# Patient Record
Sex: Male | Born: 1985 | Race: Asian | Hispanic: No | Smoking: Never smoker
Health system: Southern US, Community
[De-identification: ages and names within clinical notes are randomized; demographics above are authoritative.]

---

## 2004-06-04 ENCOUNTER — Inpatient Hospital Stay (HOSPITAL_COMMUNITY): Admission: AC | Admit: 2004-06-04 | Discharge: 2004-06-04 | Payer: Self-pay
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
## 2022-08-23 ENCOUNTER — Ambulatory Visit
Admission: EM | Admit: 2022-08-23 | Discharge: 2022-08-23 | Disposition: A | Discharge status: Home / Self Care | Payer: 59 | Attending: Nurse Practitioner | Admitting: Nurse Practitioner

## 2022-08-23 DIAGNOSIS — J029 Acute pharyngitis, unspecified: Secondary | ICD-10-CM

## 2022-08-23 DIAGNOSIS — J101 Influenza due to other identified influenza virus with other respiratory manifestations: Secondary | ICD-10-CM | POA: Diagnosis not present

## 2022-08-23 LAB — POCT INFLUENZA A/B
Influenza A, POC: NEGATIVE — AB
Influenza B, POC: POSITIVE — AB

## 2022-08-23 LAB — POCT RAPID STREP A (OFFICE): Rapid Strep A Screen: NEGATIVE

## 2022-08-23 MED ORDER — OSELTAMIVIR PHOSPHATE 75 MG PO CAPS: 75.0000 mg | ORAL_CAPSULE | Freq: Two times a day (BID) | ORAL | 0 refills | Status: AC

## 2022-08-23 MED ORDER — PROMETHAZINE-DM 6.25-15 MG/5ML PO SYRP: 5.0000 mL | ORAL_SOLUTION | Freq: Four times a day (QID) | ORAL | 0 refills | Status: AC | PRN

## 2022-08-23 NOTE — Discharge Instructions (Signed)
Tamiflu twice daily for 5 days Promethazine DM as needed for cough.  Please do this medication make you drowsy.  Do not drink alcohol or drive on the medication Rest and fluids Over-the-counter Tylenol or ibuprofen as needed for fever management Salt water gargles and warm liquids for your sore throat Follow-up with your PCP if symptoms do not improve Please go to the emergency room for any worsening symptoms

## 2022-08-23 NOTE — ED Provider Notes (Addendum)
[facility]    CSN: [id number] Arrival date & time: 08/23/22  0803      History   Chief Complaint Chief Complaint  Patient presents with   Sore Throat   Fatigue    HPI Shane Le is a 37 y.o. male presents for evaluation of URI symptoms for 2 days. Patient reports associated symptoms of sore throat, fatigue, body aches, congestion. Denies N/V/D, cough, ear pain, shortness of breath. Patient does not have a hx of asthma or smoking.  States wife was recently ill.  Pt has taken DayQuil NyQuil OTC for symptoms. Pt has no other concerns at this time.    Sore Throat    History reviewed. No pertinent past medical history.  There are no problems to display for this patient.   History reviewed. No pertinent surgical history.     Home Medications    Prior to Admission medications   Medication Sig Start Date End Date Taking? Authorizing Provider  oseltamivir (TAMIFLU) 75 MG capsule Take 1 capsule (75 mg total) by mouth every 12 (twelve) hours. 08/23/22  Yes Melynda Ripple, NP  promethazine-dextromethorphan (PROMETHAZINE-DM) 6.25-15 MG/5ML syrup Take 5 mLs by mouth 4 (four) times daily as needed for cough. 08/23/22  Yes Melynda Ripple, NP    Family History History reviewed. No pertinent family history.  Social History Social History   Tobacco Use   Smoking status: Never   Smokeless tobacco: Never     Allergies   Patient has no allergy information on record.   Review of Systems Review of Systems  Constitutional:  Positive for fatigue.  HENT:  Positive for congestion and sore throat.   Musculoskeletal:  Positive for myalgias.     Physical Exam Triage Vital Signs ED Triage Vitals [08/23/22 0814]  Enc Vitals Group     BP 120/76     Pulse Rate 91     Resp 18     Temp 100.2 F (37.9 C)     Temp Source Oral     SpO2 98 %     Weight      Height      Head Circumference      Peak Flow      Pain Score      Pain Loc      Pain Edu?      Excl. in [place]?     No data found.  Updated Vital Signs BP 120/76 (BP Location: Left Arm)   Pulse 91   Temp 100.2 F (37.9 C) (Oral)   Resp 18   SpO2 98%   Visual Acuity Right Eye Distance:   Left Eye Distance:   Bilateral Distance:    Right Eye Near:   Left Eye Near:    Bilateral Near:     Physical Exam Vitals and nursing note reviewed.  Constitutional:      General: He is not in acute distress.    Appearance: Normal appearance. He is not ill-appearing or toxic-appearing.  HENT:     Head: Normocephalic and atraumatic.     Right Ear: Tympanic membrane and ear canal normal.     Left Ear: Tympanic membrane and ear canal normal.     Nose: Congestion present.     Mouth/Throat:     Mouth: Mucous membranes are moist.     Pharynx: Posterior oropharyngeal erythema present.  Eyes:     Pupils: Pupils are equal, round, and reactive to light.  Cardiovascular:     Rate and Rhythm:  Normal rate and regular rhythm.     Heart sounds: Normal heart sounds.  Pulmonary:     Effort: Pulmonary effort is normal.     Breath sounds: Normal breath sounds.  Musculoskeletal:     Cervical back: Normal range of motion and neck supple.  Lymphadenopathy:     Cervical: No cervical adenopathy.  Skin:    General: Skin is warm and dry.  Neurological:     General: No focal deficit present.     Mental Status: He is alert and oriented to person, place, and time.  Psychiatric:        Mood and Affect: Mood normal.        Behavior: Behavior normal.      UC Treatments / Results  Labs (all labs ordered are listed, but only abnormal results are displayed) Labs Reviewed  POCT INFLUENZA A/B - Abnormal; Notable for the following components:      Result Value   Influenza A, POC Negative (*)    Influenza B, POC Positive (*)    All other components within normal limits  POCT RAPID STREP A (OFFICE)  POCT RAPID STREP A (OFFICE)    EKG   Radiology No results found.  Procedures Procedures (including critical  care time)  Medications Ordered in UC Medications - No data to display  Initial Impression / Assessment and Plan / UC Course  I have reviewed the triage vital signs and the nursing notes.  Pertinent labs & imaging results that were available during my care of the patient were reviewed by me and considered in my medical decision making (see chart for details).     Neg rapid strep  Positive influenza B Start Tamiflu Promethazine DM.  Side effect profile reviewed Rest and fluids PCP follow-up 2 to 3 days for recheck ER precautions reviewed and patient verbalized understanding Final Clinical Impressions(s) / UC Diagnoses   Final diagnoses:  Sore throat  Influenza B     Discharge Instructions      Tamiflu twice daily for 5 days Promethazine DM as needed for cough.  Please do this medication make you drowsy.  Do not drink alcohol or drive on the medication Rest and fluids Over-the-counter Tylenol or ibuprofen as needed for fever management Salt water gargles and warm liquids for your sore throat Follow-up with your PCP if symptoms do not improve Please go to the emergency room for any worsening symptoms     ED Prescriptions     Medication Sig Dispense Auth. Provider   promethazine-dextromethorphan (PROMETHAZINE-DM) 6.25-15 MG/5ML syrup Take 5 mLs by mouth 4 (four) times daily as needed for cough. 118 mL Melynda Ripple, NP   oseltamivir (TAMIFLU) 75 MG capsule Take 1 capsule (75 mg total) by mouth every 12 (twelve) hours. 10 capsule Melynda Ripple, NP      PDMP not reviewed this encounter.   Melynda Ripple, NP 08/23/22 0849    Melynda Ripple, NP 08/23/22 [phone]

## 2022-08-23 NOTE — ED Triage Notes (Signed)
Pt states sore throat, fatigue and body aches for several days. Pt is taking Nyquil.
